# Patient Record
Sex: Female | Born: 1955 | Race: White | Hispanic: No | Marital: Married
Health system: Southern US, Community
[De-identification: ages and names within clinical notes are randomized; demographics above are authoritative.]

---

## 2003-02-19 ENCOUNTER — Emergency Department (HOSPITAL_COMMUNITY): Admission: EM | Admit: 2003-02-19 | Discharge: 2003-02-20 | Payer: Self-pay | Admitting: Podiatry

## 2003-12-16 ENCOUNTER — Other Ambulatory Visit: Payer: Self-pay

## 2003-12-16 ENCOUNTER — Emergency Department: Payer: Self-pay | Admitting: Emergency Medicine

## 2004-01-18 ENCOUNTER — Emergency Department: Payer: Self-pay | Admitting: Internal Medicine

## 2004-02-23 ENCOUNTER — Emergency Department: Payer: Self-pay | Admitting: Emergency Medicine

## 2004-06-30 ENCOUNTER — Inpatient Hospital Stay: Payer: Self-pay | Admitting: Internal Medicine

## 2005-01-07 ENCOUNTER — Emergency Department (HOSPITAL_COMMUNITY): Admission: EM | Admit: 2005-01-07 | Discharge: 2005-01-07 | Payer: Self-pay | Admitting: Emergency Medicine

## 2005-01-09 ENCOUNTER — Emergency Department (HOSPITAL_COMMUNITY): Admission: EM | Admit: 2005-01-09 | Discharge: 2005-01-09 | Payer: Self-pay | Admitting: Emergency Medicine

## 2005-01-10 ENCOUNTER — Emergency Department (HOSPITAL_COMMUNITY): Admission: EM | Admit: 2005-01-10 | Discharge: 2005-01-10 | Payer: Self-pay | Admitting: Emergency Medicine

## 2005-01-12 ENCOUNTER — Ambulatory Visit (HOSPITAL_COMMUNITY): Admission: RE | Admit: 2005-01-12 | Discharge: 2005-01-12 | Payer: Self-pay | Admitting: Emergency Medicine

## 2005-01-20 ENCOUNTER — Emergency Department (HOSPITAL_COMMUNITY): Admission: EM | Admit: 2005-01-20 | Discharge: 2005-01-21 | Payer: Self-pay | Admitting: Emergency Medicine

## 2005-04-05 ENCOUNTER — Emergency Department (HOSPITAL_COMMUNITY): Admission: EM | Admit: 2005-04-05 | Discharge: 2005-04-06 | Payer: Self-pay | Admitting: Emergency Medicine

## 2005-04-08 ENCOUNTER — Emergency Department (HOSPITAL_COMMUNITY): Admission: EM | Admit: 2005-04-08 | Discharge: 2005-04-08 | Payer: Self-pay | Admitting: Emergency Medicine

## 2005-04-11 ENCOUNTER — Ambulatory Visit: Payer: Self-pay | Admitting: Gastroenterology

## 2005-06-26 ENCOUNTER — Emergency Department (HOSPITAL_COMMUNITY): Admission: EM | Admit: 2005-06-26 | Discharge: 2005-06-26 | Payer: Self-pay | Admitting: Emergency Medicine

## 2005-09-12 ENCOUNTER — Inpatient Hospital Stay: Payer: Self-pay | Admitting: Internal Medicine

## 2005-09-23 ENCOUNTER — Emergency Department: Payer: Self-pay | Admitting: Emergency Medicine

## 2005-12-06 ENCOUNTER — Other Ambulatory Visit: Payer: Self-pay

## 2005-12-06 ENCOUNTER — Inpatient Hospital Stay: Payer: Self-pay | Admitting: Internal Medicine

## 2005-12-21 ENCOUNTER — Inpatient Hospital Stay: Payer: Self-pay | Admitting: Internal Medicine

## 2006-01-05 ENCOUNTER — Emergency Department (HOSPITAL_COMMUNITY): Admission: EM | Admit: 2006-01-05 | Discharge: 2006-01-05 | Payer: Self-pay | Admitting: Emergency Medicine

## 2006-01-24 ENCOUNTER — Emergency Department: Payer: Self-pay

## 2006-03-28 ENCOUNTER — Inpatient Hospital Stay: Payer: Self-pay | Admitting: Internal Medicine

## 2006-07-04 ENCOUNTER — Inpatient Hospital Stay: Payer: Self-pay | Admitting: Internal Medicine

## 2006-09-08 ENCOUNTER — Emergency Department: Payer: Self-pay | Admitting: Emergency Medicine

## 2006-09-10 ENCOUNTER — Inpatient Hospital Stay: Payer: Self-pay | Admitting: Internal Medicine

## 2006-11-30 ENCOUNTER — Inpatient Hospital Stay: Payer: Self-pay | Admitting: Internal Medicine

## 2007-02-25 ENCOUNTER — Emergency Department: Payer: Self-pay | Admitting: Emergency Medicine

## 2007-04-27 IMAGING — CR DG CHEST 2V
1 series · 2 of 2 positions shown · non-contrast
Comparison: none

REASON FOR EXAM: Cough
COMMENTS:

[Series 1: view not recorded · 0.17mm/px · 2 of 2 slices shown]
[im 1/2]
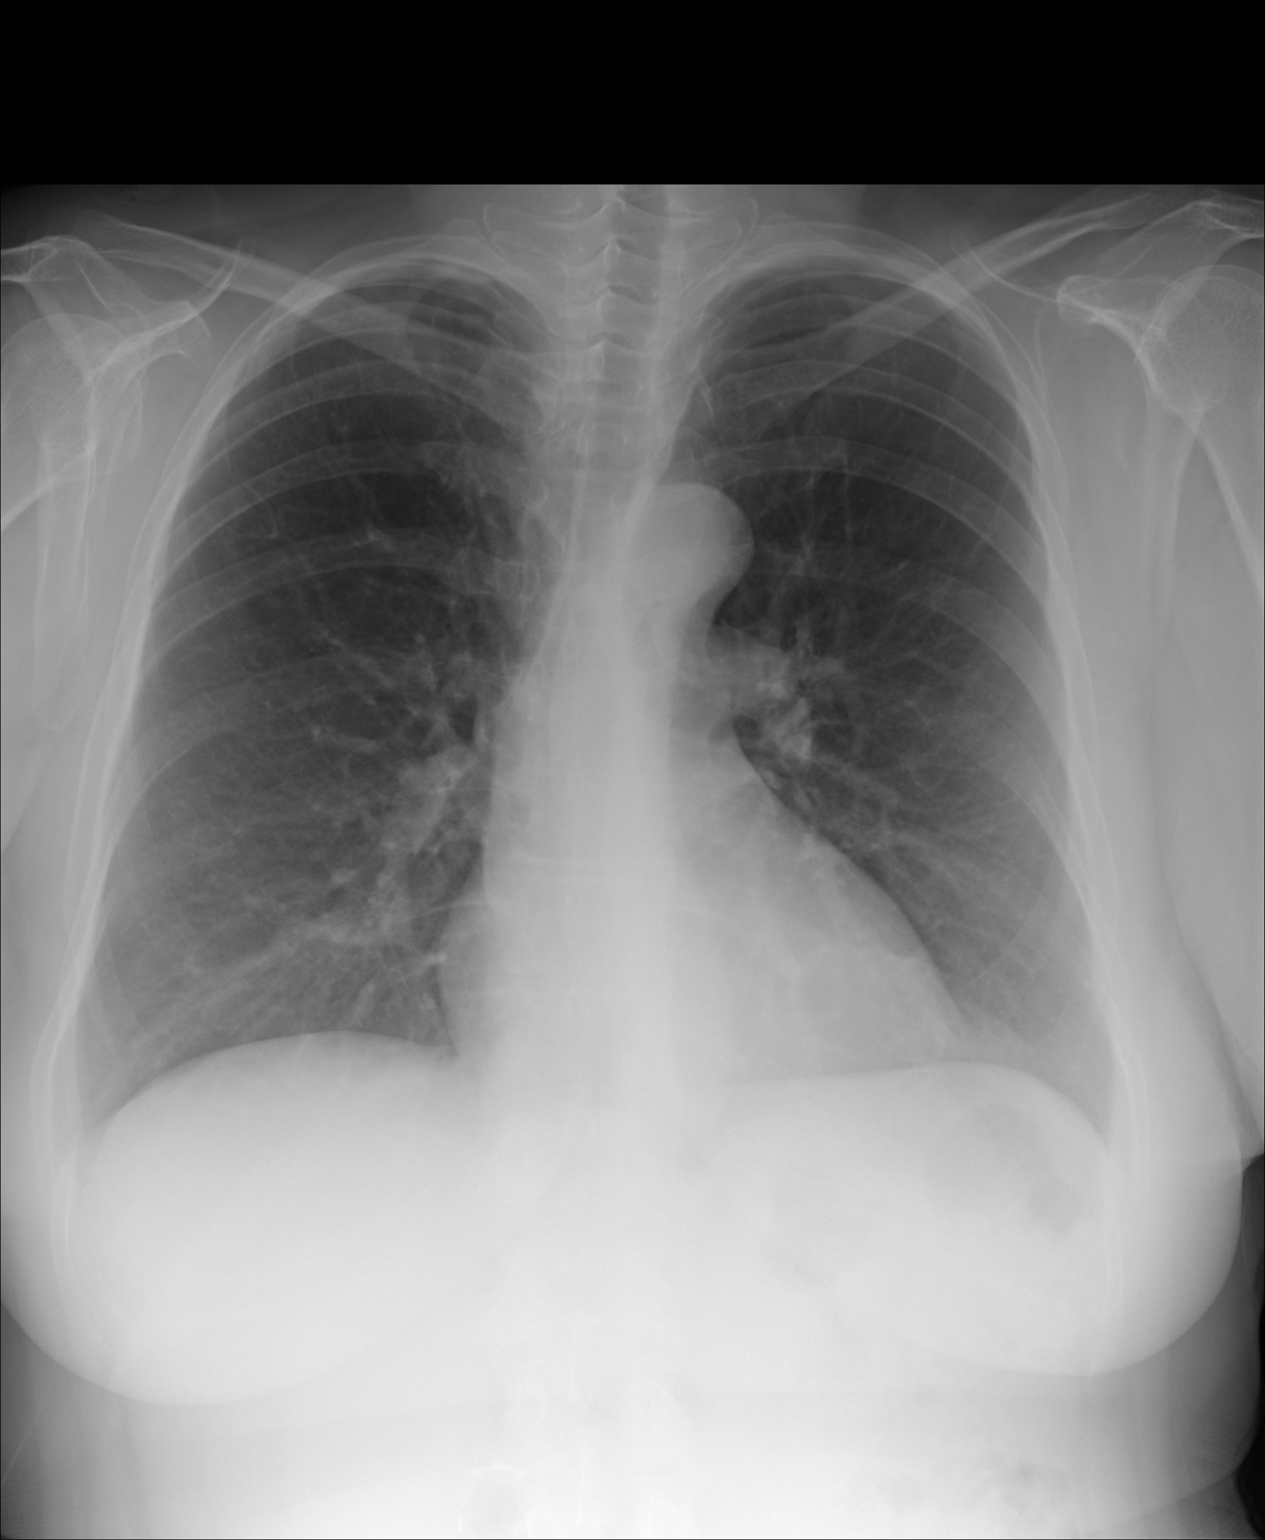
[im 2/2]
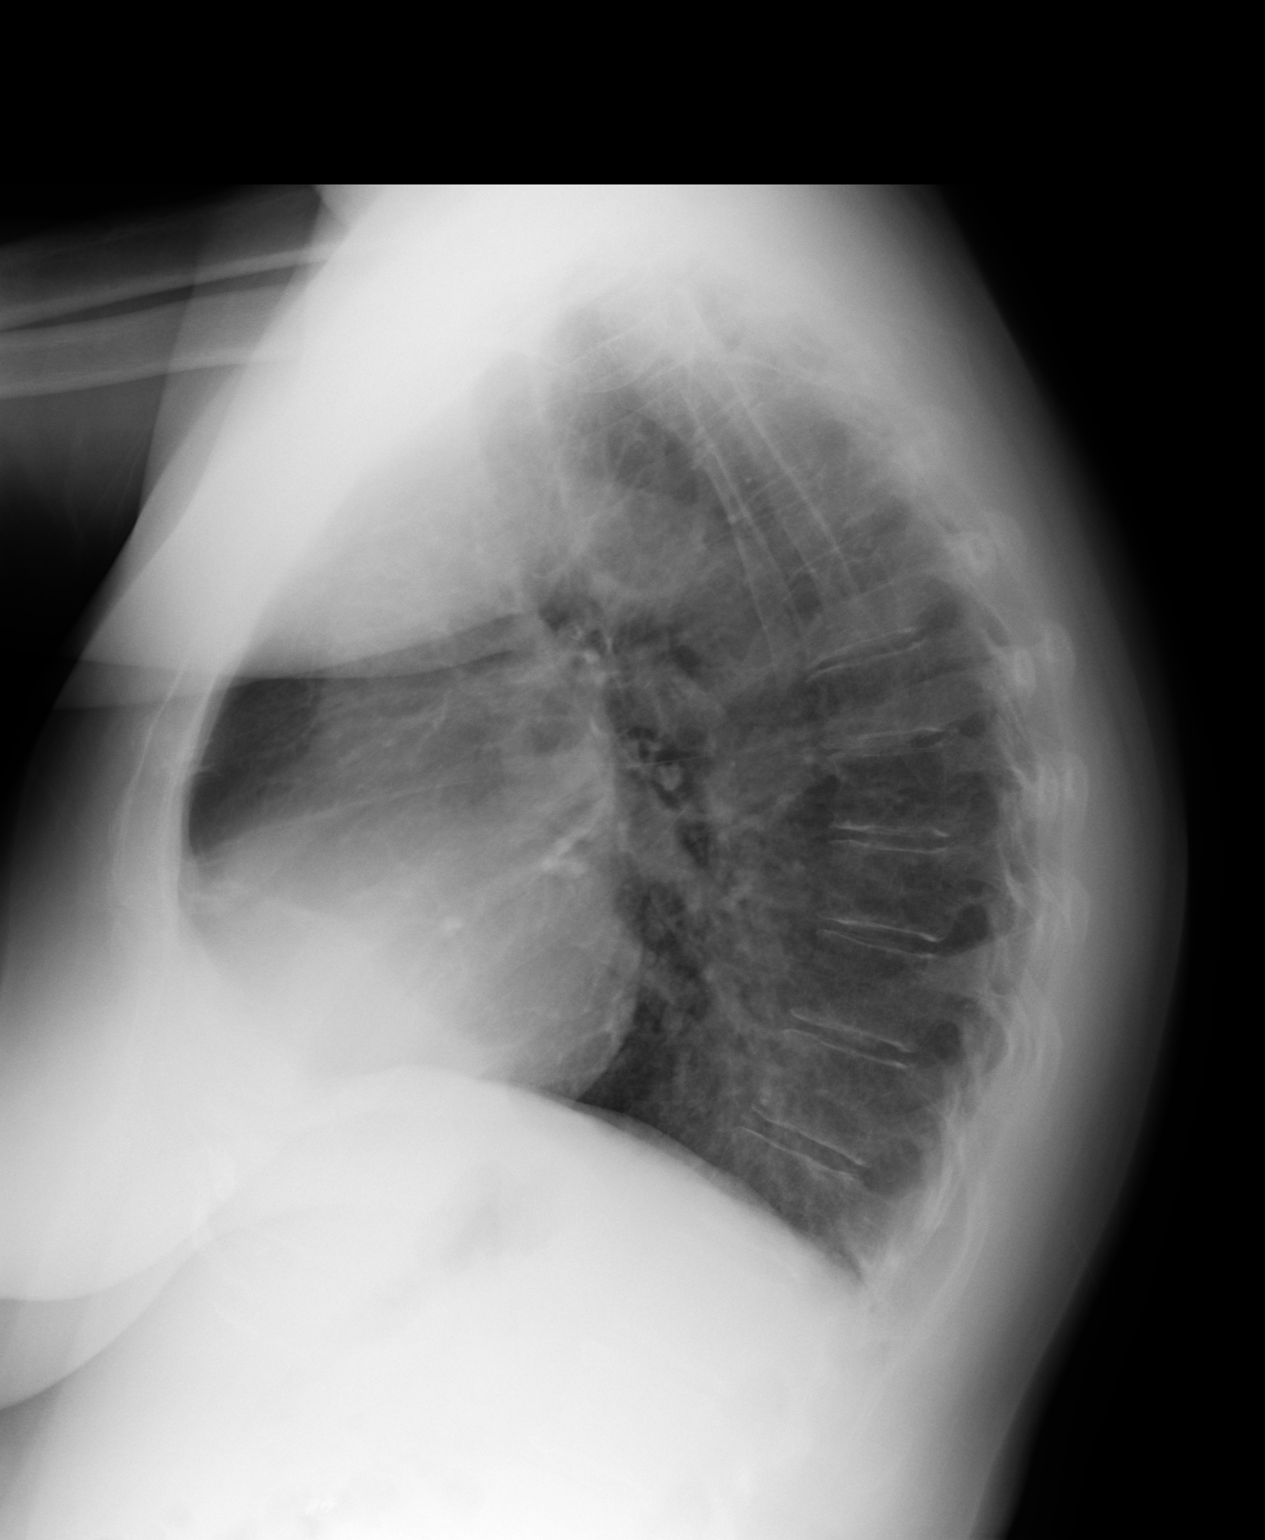

[2 of 2 positions shown; findings below may reference images not displayed]

PROCEDURE:     DXR - DXR CHEST PA (OR AP) AND LATERAL  - January 24, 2006  [DATE]

RESULT:       Comparison is made to the study of 12/06/05.  The lungs are
clear.  The cardiac silhouette and pulmonary vasculature are normal.  The
bony and mediastinal structures are within normal limits.  There is no
pneumothorax.  There is no focal mass.
IMPRESSION: No acute cardiopulmonary disease demonstrated.  There is mild hyperinflation
which can suggest COPD.

## 2007-04-30 ENCOUNTER — Emergency Department: Payer: Self-pay | Admitting: Emergency Medicine

## 2007-05-08 ENCOUNTER — Emergency Department: Payer: Self-pay | Admitting: Emergency Medicine

## 2007-07-03 ENCOUNTER — Emergency Department: Payer: Self-pay | Admitting: Internal Medicine

## 2007-08-23 ENCOUNTER — Emergency Department: Payer: Self-pay | Admitting: Emergency Medicine

## 2007-10-17 ENCOUNTER — Emergency Department: Payer: Self-pay | Admitting: Emergency Medicine

## 2008-03-25 ENCOUNTER — Emergency Department: Payer: Self-pay | Admitting: Emergency Medicine

## 2008-07-12 ENCOUNTER — Emergency Department: Payer: Self-pay | Admitting: Emergency Medicine

## 2008-07-15 ENCOUNTER — Emergency Department: Payer: Self-pay | Admitting: Emergency Medicine

## 2008-09-24 ENCOUNTER — Emergency Department: Payer: Self-pay | Admitting: Emergency Medicine

## 2008-11-23 IMAGING — CR DG ABDOMEN 3V
1 series · 4 of 4 positions shown · non-contrast
Comparison: none

REASON FOR EXAM: Abdominal pain, history of Crohn's disease
COMMENTS:

[Series 1: view not recorded · 0.17mm/px · 4 of 4 slices shown]
[im 1/4]
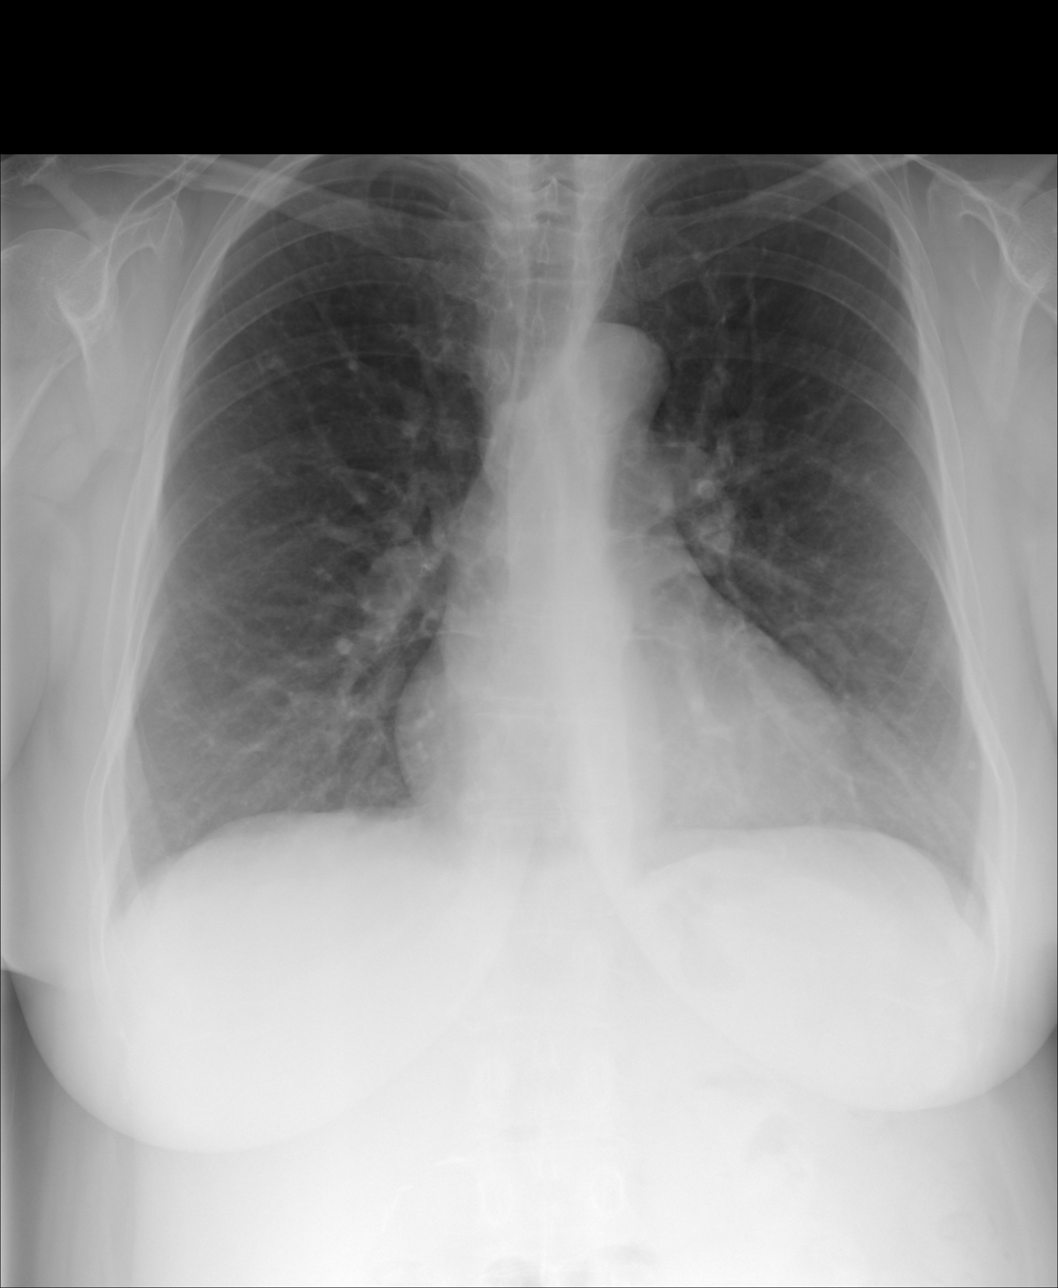
[im 2/4]
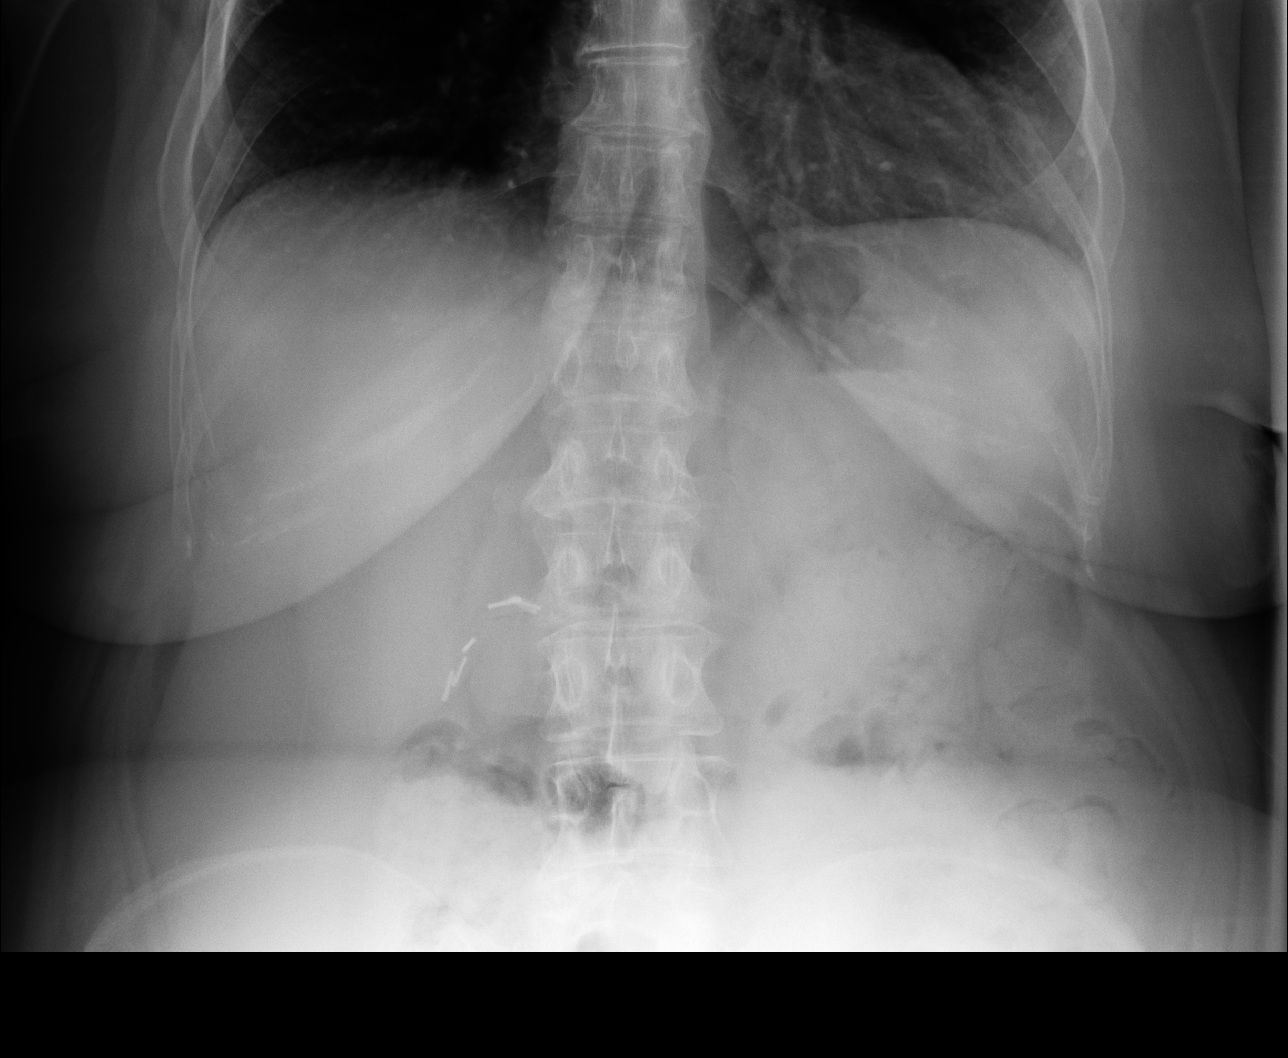
[im 3/4]
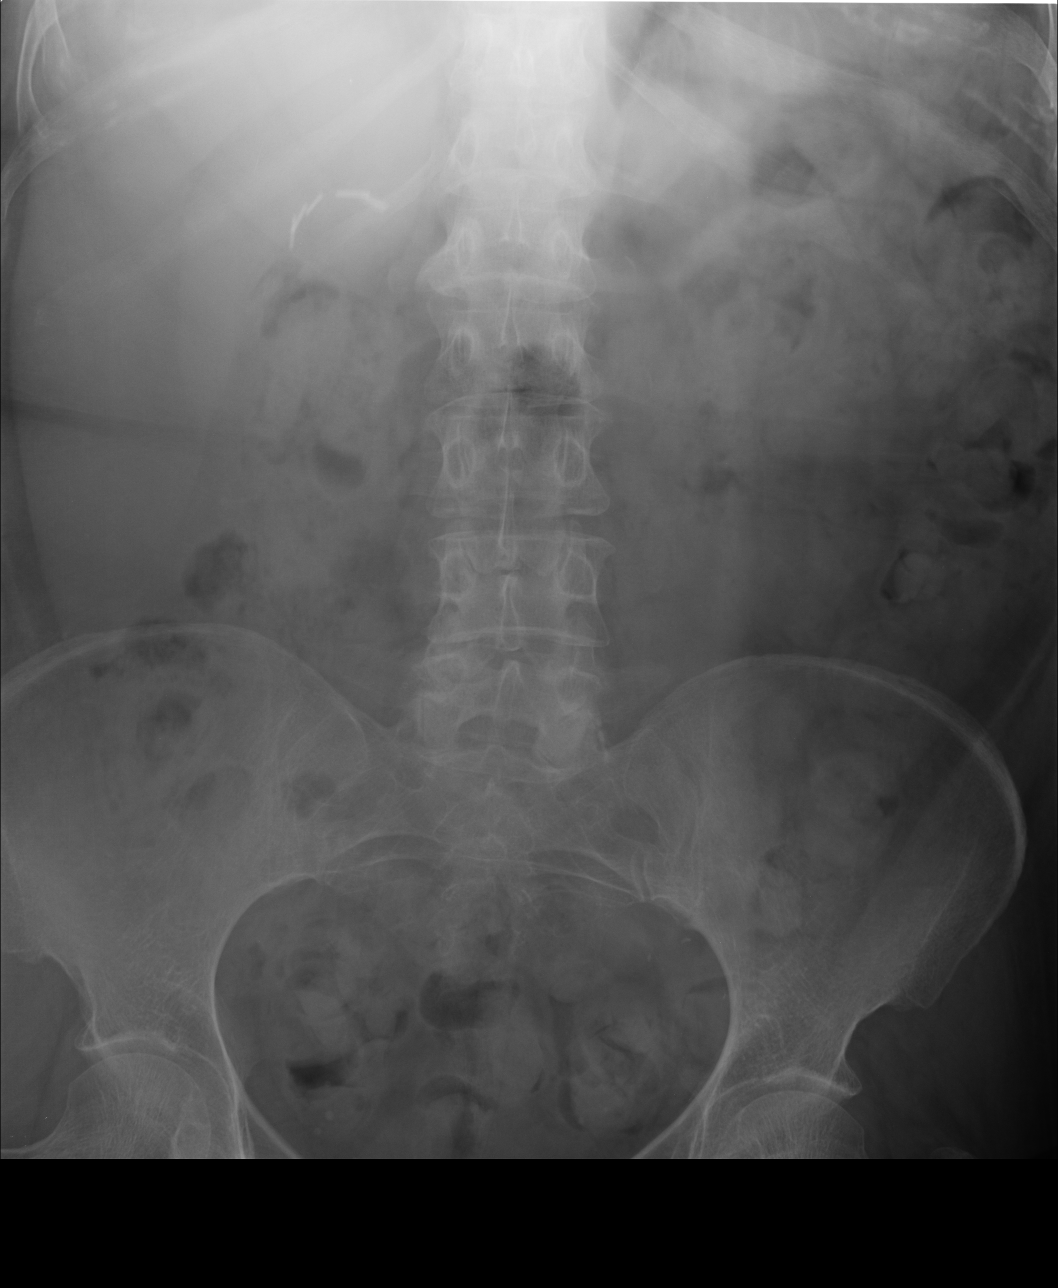
[im 4/4]
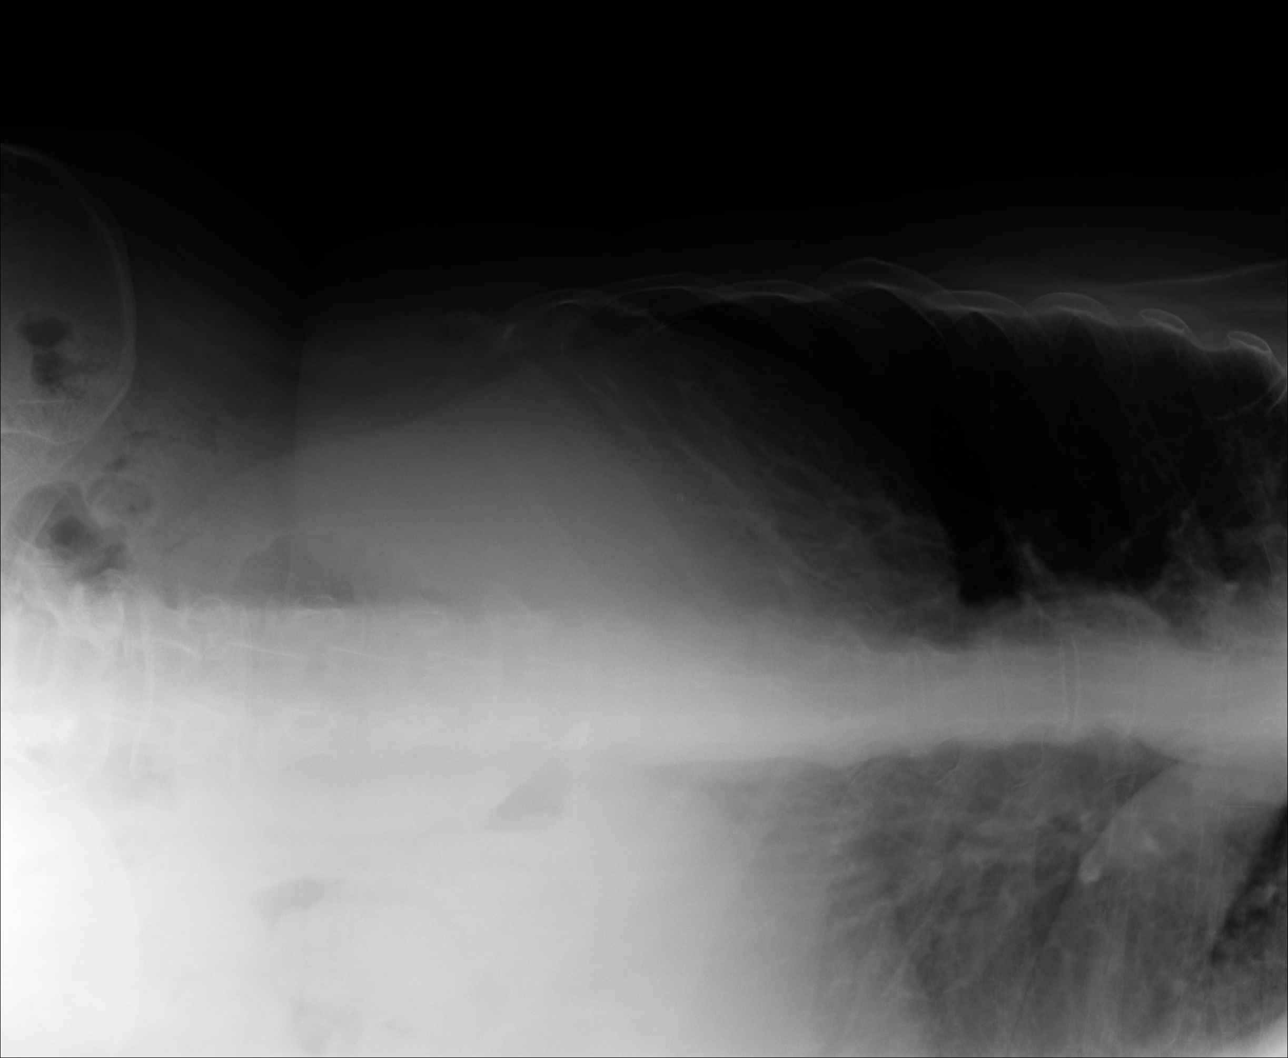

[4 of 4 positions shown; findings below may reference images not displayed]

PROCEDURE:     DXR - DXR ABDOMEN 3-WAY (INCL PA CXR)  - August 23, 2007  [DATE]

RESULT:     Comparison is made to the chest series dated 09/08/2006 as a
portion of a three way abdominal series.

The lungs remain clear. The heart is normal in size.

Cholecystectomy clips are present. The bowel gas pattern is unremarkable.
The bony structures are within normal limits. There is no free air.
IMPRESSION: 1.  No acute cardiopulmonary disease.
2.  No evidence of bowel obstruction or perforation.
3.  Cholecystectomy changes are present.

## 2009-04-11 ENCOUNTER — Emergency Department: Payer: Self-pay | Admitting: Internal Medicine

## 2009-08-01 ENCOUNTER — Emergency Department: Payer: Self-pay | Admitting: Emergency Medicine

## 2009-08-05 ENCOUNTER — Inpatient Hospital Stay: Payer: Self-pay | Admitting: Internal Medicine

## 2009-10-13 IMAGING — CR DG CHEST 2V
1 series · 2 of 2 positions shown · non-contrast
Comparison: none

REASON FOR EXAM: rib pain
COMMENTS:

PROCEDURE:     DXR - DXR CHEST PA (OR AP) AND LATERAL  - July 12, 2008  [DATE]
RESULT:     Comparison: [id number], 07/03/2007

[Series 1: view not recorded · 0.17mm/px · 2 of 2 slices shown]
[im 1/2]
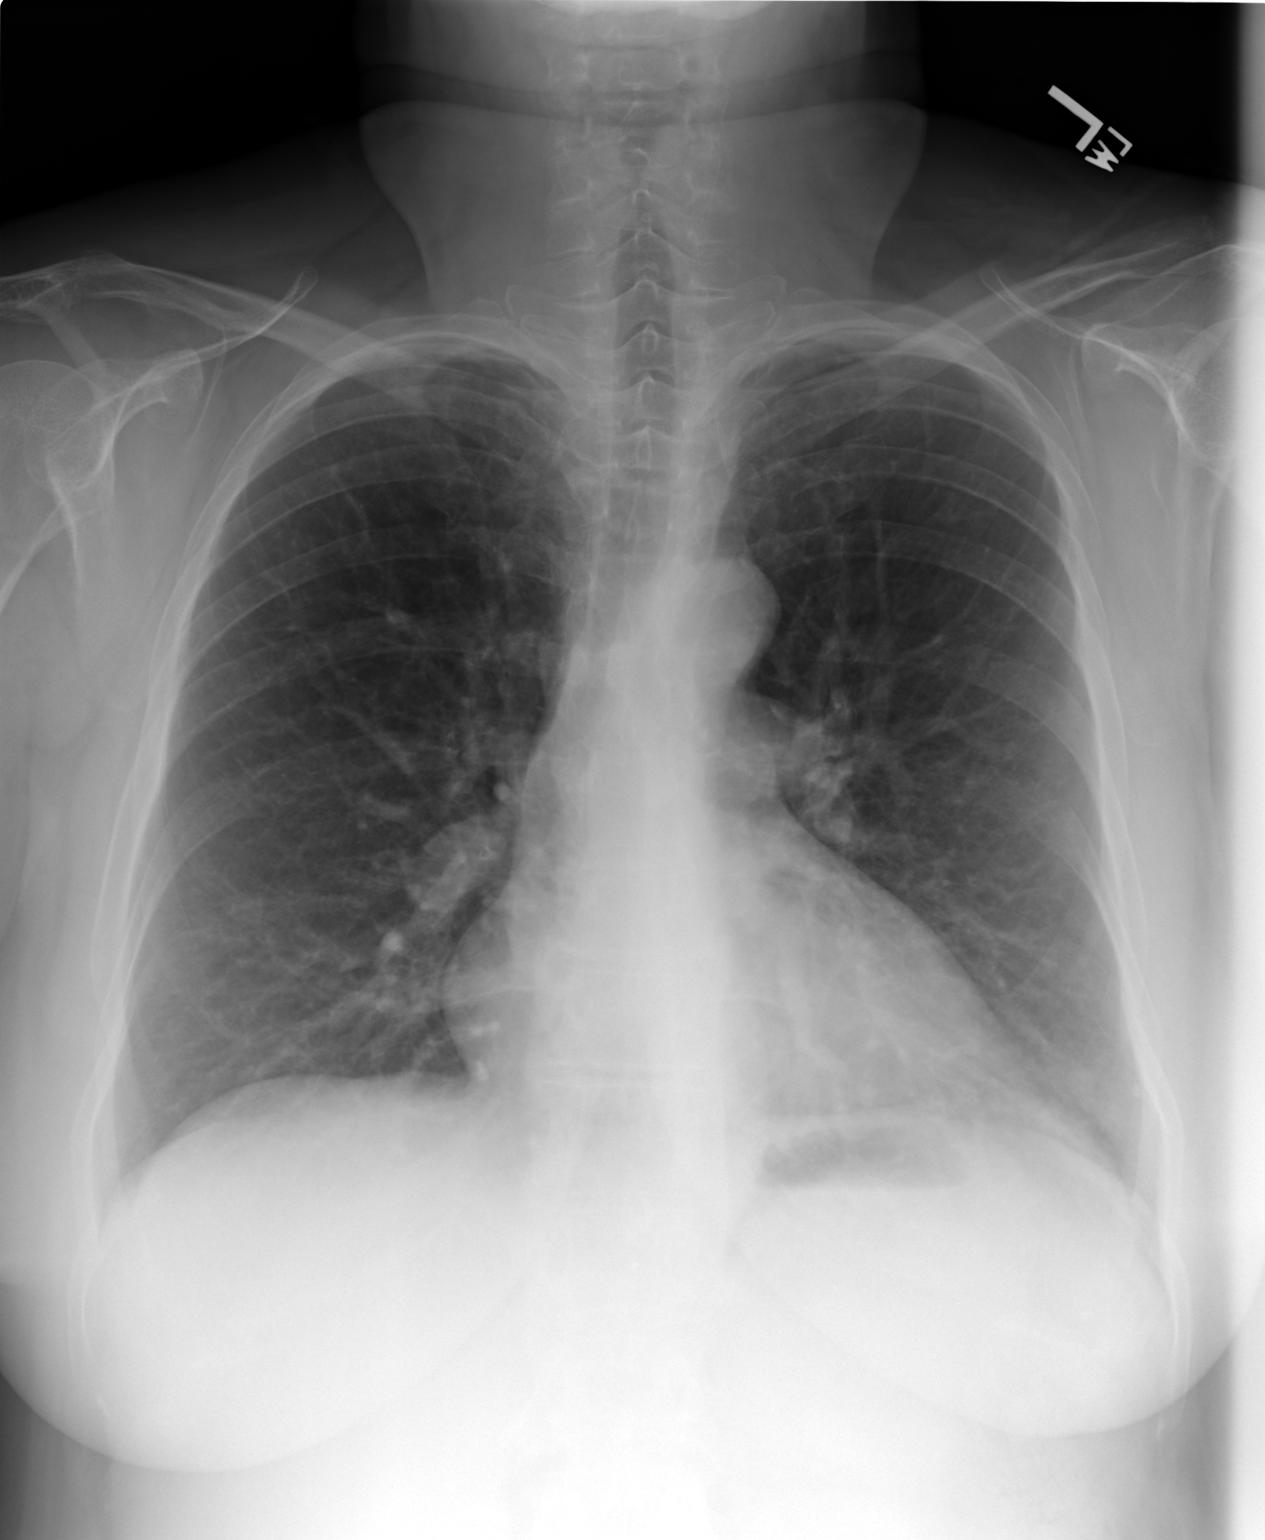
[im 2/2]
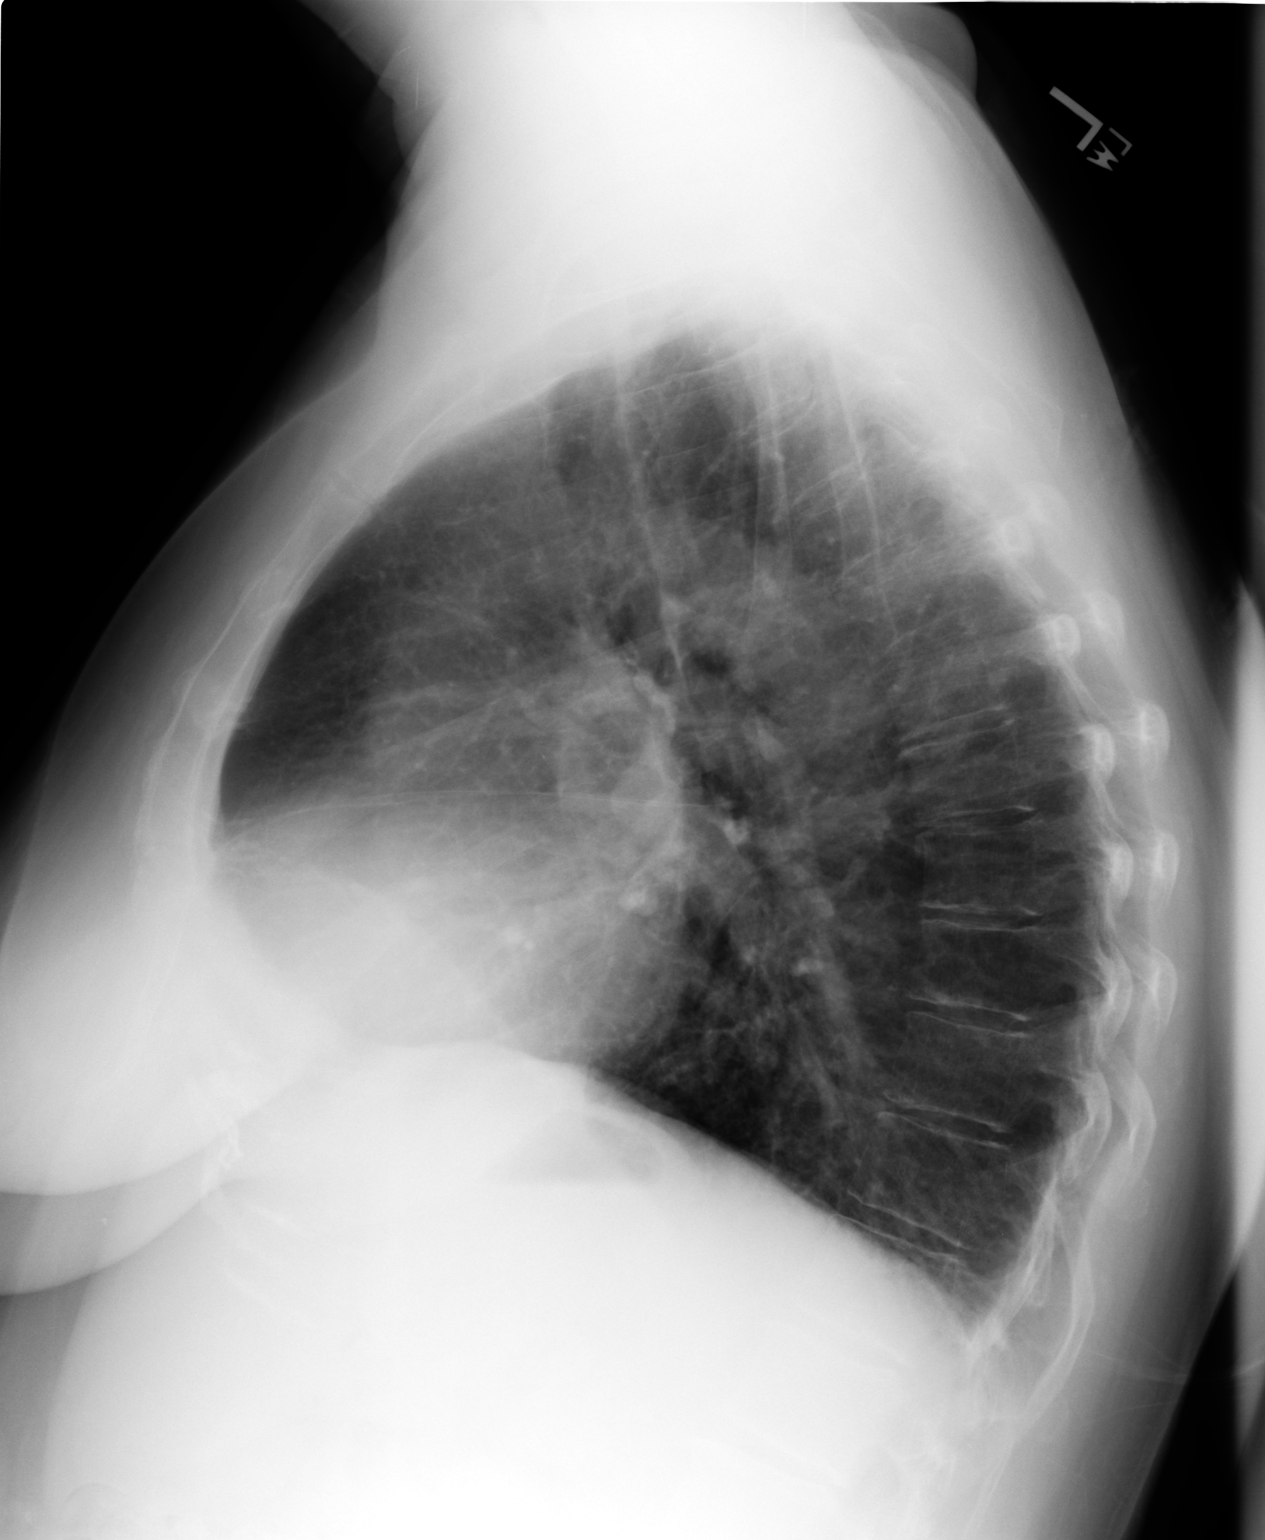

[2 of 2 positions shown; findings below may reference images not displayed]

FINDINGS: PA and lateral chest radiographs are provided. There is no focal parenchymal
opacity, pleural effusion, or pneumothorax. The heart and mediastinum are
unremarkable. The osseous structures are unremarkable.
IMPRESSION: No acute disease of the chest.

## 2009-10-13 IMAGING — CT CT STONE STUDY
1 of 2 series · 15 of 32 positions shown, 19 images · non-contrast
Comparison: 10/17/2007, 09/10/2006, 03/23/2006

REASON FOR EXAM: back achy (R side)_ pt thinks feels like prior renal
colic
COMMENTS:

PROCEDURE:     CT  - CT ABDOMEN /PELVIS WO (STONE)  - July 12, 2008  [DATE]
RESULT:     Indication: Right-sided pain
TECHNIQUE: Multiple axial images from the lung bases to the symphysis pubis
were obtained without oral or intravenous contrast.

[Series 2: stone · axial · 0.77mm/px · z∈[-450,-66]mm · 15 of 145 slices shown, 19 images]
[im 11/145  soft-tissue]
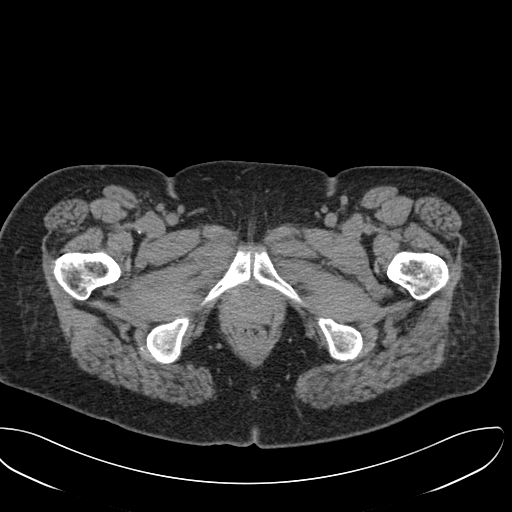
[im 11/145  bone]
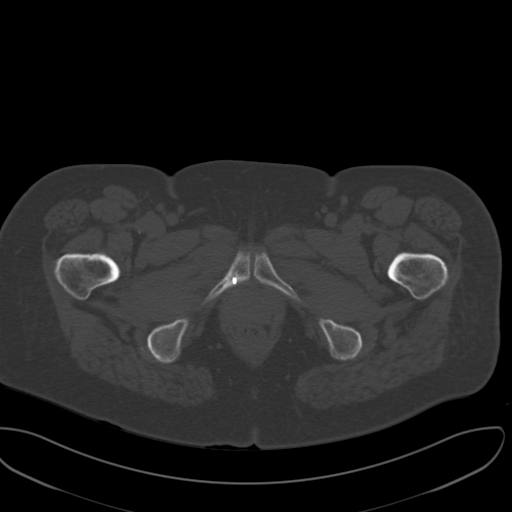
[im 22/145  soft-tissue]
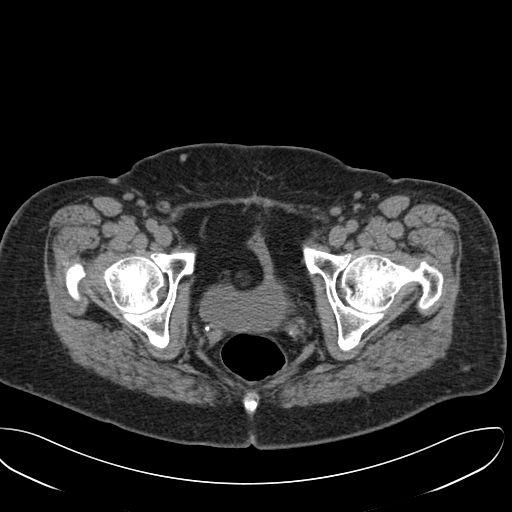
[im 33/145  soft-tissue]
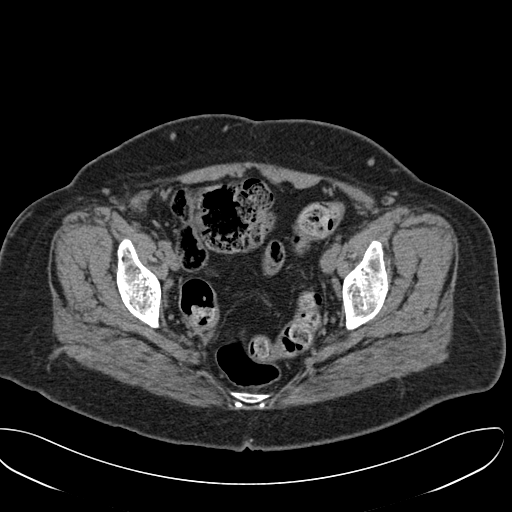
[im 43/145  soft-tissue]
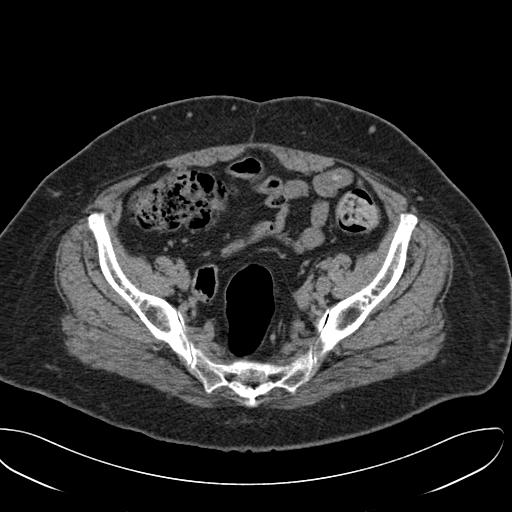
[im 54/145  soft-tissue]
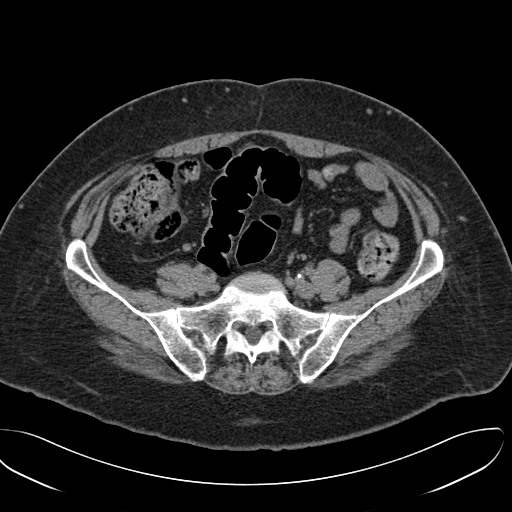
[im 65/145  soft-tissue]
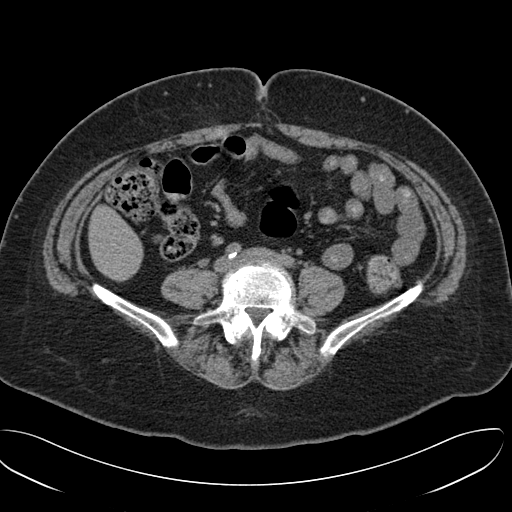
[im 75/145  soft-tissue]
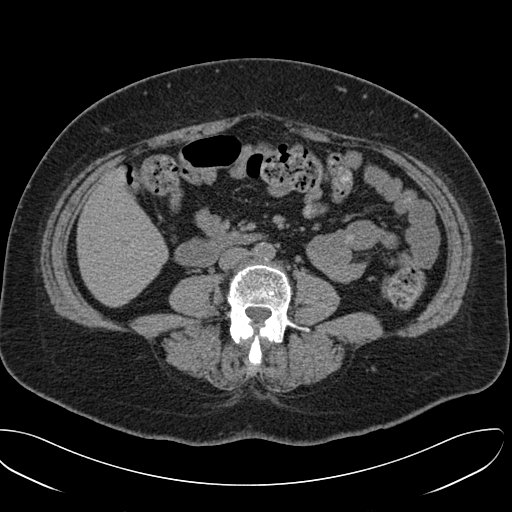
[im 86/145  soft-tissue]
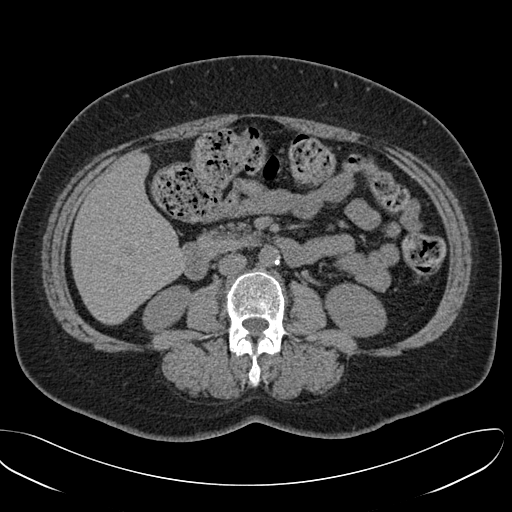
[im 97/145  soft-tissue]
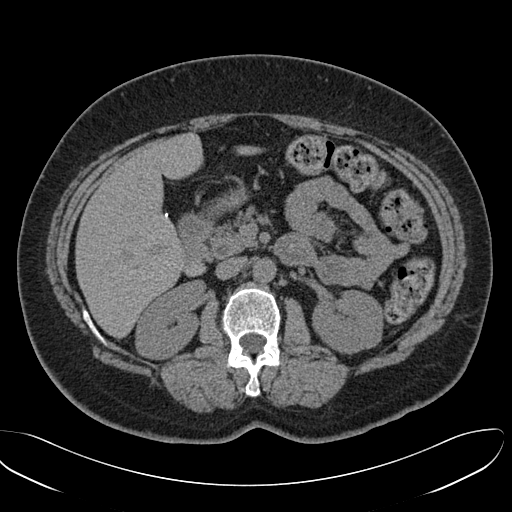
[im 97/145  bone]
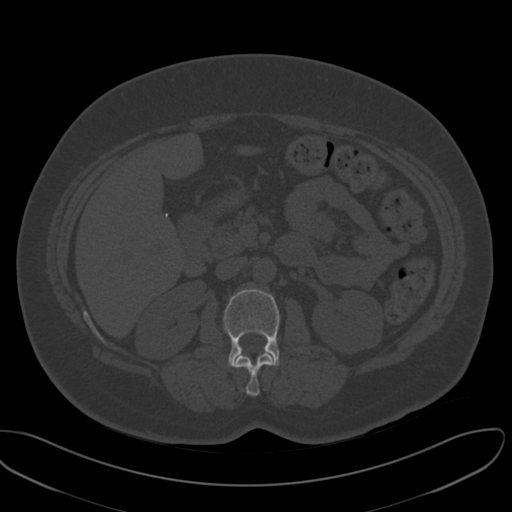
[im 107/145  soft-tissue]
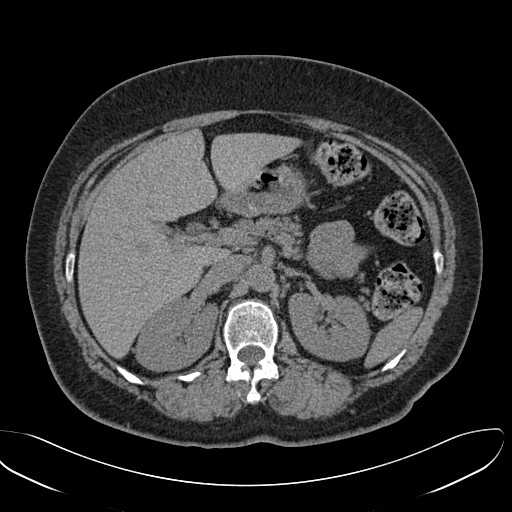
[im 118/145  soft-tissue]
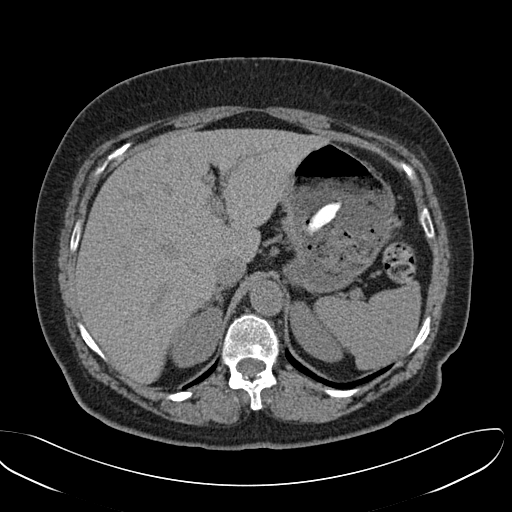
[im 123/145  lung]
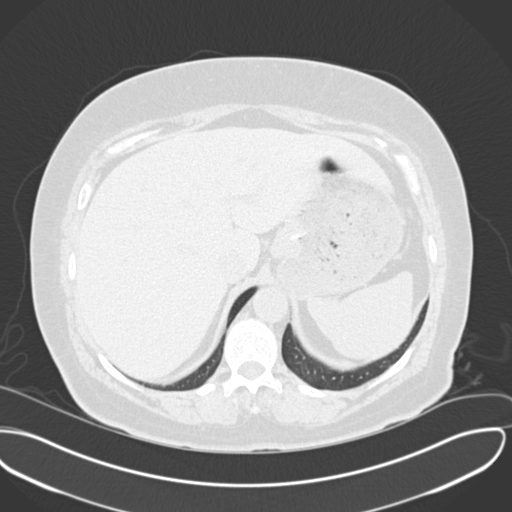
[im 129/145  soft-tissue]
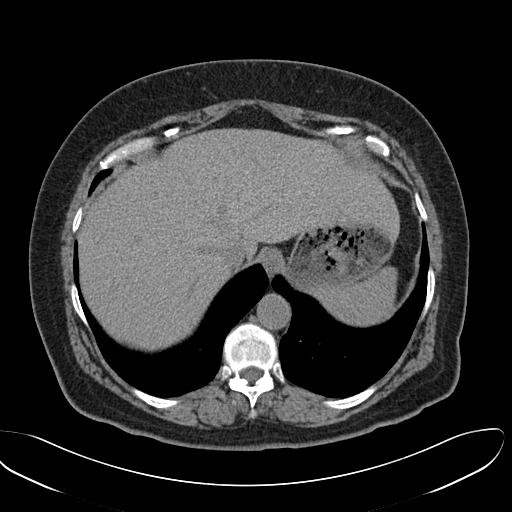
[im 129/145  lung]
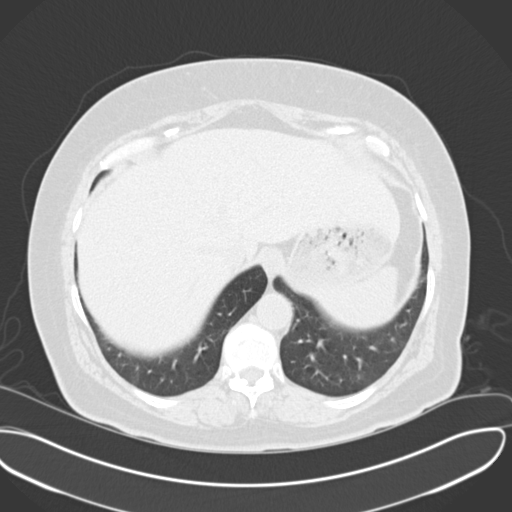
[im 134/145  lung]
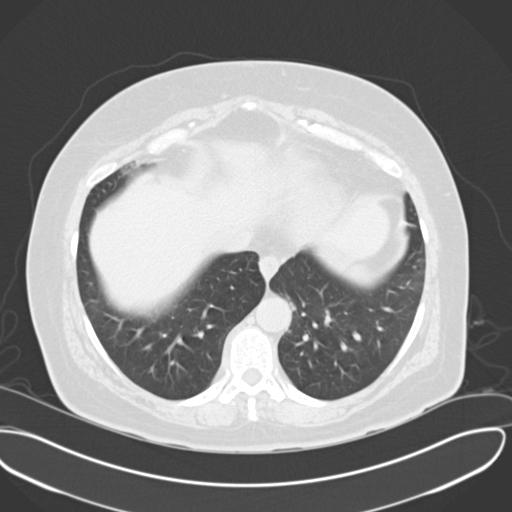
[im 139/145  soft-tissue]
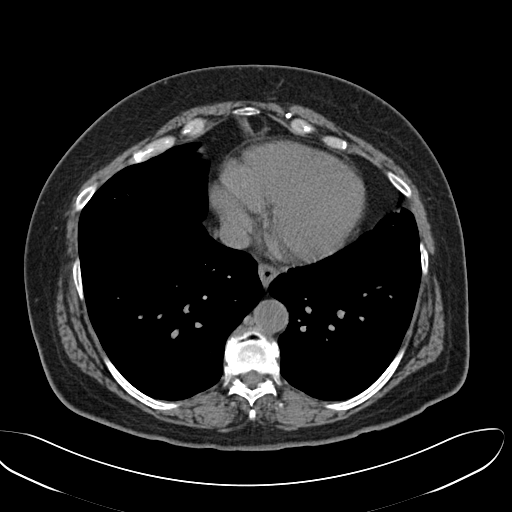
[im 139/145  lung]
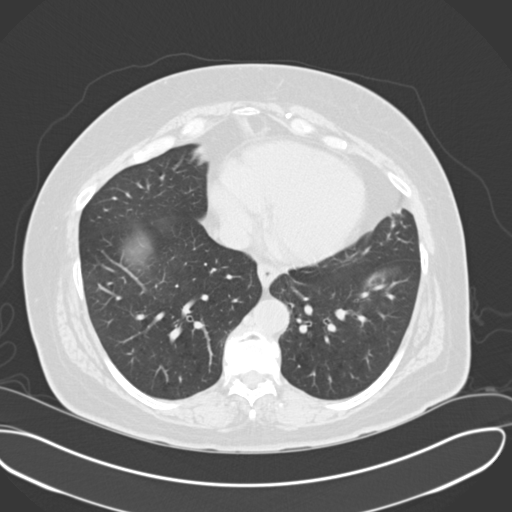

[15 of 32 positions shown; findings below may reference images not displayed]

FINDINGS: The lung bases are clear. There is no pleural or pericardial effusions.

No renal, ureteral, or bladder calculi. No obstructive uropathy. No
perinephric stranding is seen. The kidneys are symmetric in size without
evidence for exophytic mass. The bladder is unremarkable.

The liver demonstrates no focal abnormality. The gallbladder is surgically
absent. The spleen demonstrates no focal abnormality. The adrenal glands and
pancreas are normal.

The unopacified stomach, duodenum, small intestine, and large intestine are
unremarkable, but evaluation is limited by lack of oral contrast. There is a
moderate amount of stool throughout the colon. There is no pneumoperitoneum,
pneumatosis, or portal venous gas. There is no abdominal or pelvic free
fluid. There is no lymphadenopathy.

The abdominal aorta is normal in caliber with scattered atherosclerosis.

There is lumbar spine spondylosis.
IMPRESSION: 1. No urolithiasis or obstructive uropathy.

2. Moderate amount of stool throughout the colon.

## 2011-01-19 LAB — URINALYSIS, COMPLETE
Bilirubin,UR: NEGATIVE
Glucose,UR: NEGATIVE mg/dL (ref 0–75)
Nitrite: NEGATIVE
Ph: 6 (ref 4.5–8.0)
RBC,UR: 2 /HPF (ref 0–5)
Squamous Epithelial: 1

## 2011-01-19 LAB — BASIC METABOLIC PANEL
BUN: 8 mg/dL (ref 7–18)
Creatinine: 0.5 mg/dL — ABNORMAL LOW (ref 0.60–1.30)
EGFR (Non-African Amer.): >60
Glucose: 112 mg/dL — ABNORMAL HIGH (ref 65–99)
Potassium: 4.4 mmol/L (ref 3.5–5.1)
Sodium: 137 mmol/L (ref 136–145)

## 2011-01-19 LAB — CBC
HGB: 14.4 g/dL (ref 12.0–16.0)
MCHC: 32.3 g/dL (ref 32.0–36.0)
WBC: 17.3 10*3/uL — ABNORMAL HIGH (ref 3.6–11.0)

## 2011-01-20 ENCOUNTER — Inpatient Hospital Stay: Payer: Self-pay | Admitting: Internal Medicine

## 2011-01-20 LAB — CK TOTAL AND CKMB (NOT AT ARMC)
CK, Total: 195 U/L (ref 21–215)
CK-MB: 1.4 ng/mL (ref 0.5–3.6)
CK-MB: 3.6 ng/mL (ref 0.5–3.6)

## 2011-01-20 LAB — TROPONIN I: Troponin-I: <0.02 ng/mL

## 2011-01-21 LAB — CBC WITH DIFFERENTIAL/PLATELET
Basophil %: 0.2 %
Eosinophil #: 0 10*3/uL (ref 0.0–0.7)
Eosinophil %: 0 %
HCT: 41.7 % (ref 35.0–47.0)
Lymphocyte %: 8.2 %
MCHC: 32.6 g/dL (ref 32.0–36.0)
MCV: 91 fL (ref 80–100)
Monocyte #: 0.8 10*3/uL — ABNORMAL HIGH (ref 0.0–0.7)
Monocyte %: 4.3 %
RDW: 17.1 % — ABNORMAL HIGH (ref 11.5–14.5)
WBC: 19.8 10*3/uL — ABNORMAL HIGH (ref 3.6–11.0)

## 2011-01-21 LAB — BASIC METABOLIC PANEL
Anion Gap: 14 (ref 7–16)
BUN: 13 mg/dL (ref 7–18)
Chloride: 99 mmol/L (ref 98–107)
Creatinine: 0.58 mg/dL — ABNORMAL LOW (ref 0.60–1.30)
EGFR (African American): >60

## 2011-01-21 LAB — URINE CULTURE

## 2011-01-21 LAB — LIPID PANEL: Ldl Cholesterol, Calc: 142 mg/dL — ABNORMAL HIGH (ref 0–100)

## 2011-01-21 LAB — HEMOGLOBIN A1C: Hemoglobin A1C: 5.8 % (ref 4.2–6.3)

## 2011-01-22 LAB — TROPONIN I: Troponin-I: <0.02 ng/mL

## 2011-01-22 LAB — CK TOTAL AND CKMB (NOT AT ARMC)
CK, Total: 138 U/L (ref 21–215)
CK-MB: 4.3 ng/mL — ABNORMAL HIGH (ref 0.5–3.6)
CK-MB: 2.2 ng/mL (ref 0.5–3.6)

## 2011-01-22 LAB — BASIC METABOLIC PANEL
Chloride: 101 mmol/L (ref 98–107)
Co2: 30 mmol/L (ref 21–32)
Creatinine: 0.75 mg/dL (ref 0.60–1.30)
EGFR (African American): >60
Potassium: 3.6 mmol/L (ref 3.5–5.1)
Sodium: 141 mmol/L (ref 136–145)

## 2011-01-23 LAB — CK TOTAL AND CKMB (NOT AT ARMC)
CK, Total: 52 U/L (ref 21–215)
CK-MB: 1.9 ng/mL (ref 0.5–3.6)

## 2011-01-24 LAB — WBC: WBC: 19.1 10*3/uL — ABNORMAL HIGH (ref 3.6–11.0)

## 2011-04-12 ENCOUNTER — Emergency Department: Payer: Self-pay | Admitting: Emergency Medicine

## 2011-04-12 LAB — URINALYSIS, COMPLETE
Bilirubin,UR: NEGATIVE
Glucose,UR: NEGATIVE mg/dL (ref 0–75)
Ketone: NEGATIVE
Nitrite: NEGATIVE
Ph: 7 (ref 4.5–8.0)
Protein: NEGATIVE
WBC UR: <1 /HPF (ref 0–5)

## 2011-04-12 LAB — COMPREHENSIVE METABOLIC PANEL
Albumin: 3.1 g/dL — ABNORMAL LOW (ref 3.4–5.0)
Alkaline Phosphatase: 109 U/L (ref 50–136)
Anion Gap: 11 (ref 7–16)
BUN: 5 mg/dL — ABNORMAL LOW (ref 7–18)
Bilirubin,Total: 0.2 mg/dL (ref 0.2–1.0)
EGFR (African American): >60
Glucose: 106 mg/dL — ABNORMAL HIGH (ref 65–99)
Sodium: 140 mmol/L (ref 136–145)
Total Protein: 8.6 g/dL — ABNORMAL HIGH (ref 6.4–8.2)

## 2011-04-12 LAB — CBC
HCT: 40.7 % (ref 35.0–47.0)
MCHC: 32.3 g/dL (ref 32.0–36.0)
Platelet: 496 10*3/uL — ABNORMAL HIGH (ref 150–440)
RDW: 17.7 % — ABNORMAL HIGH (ref 11.5–14.5)
WBC: 16.6 10*3/uL — ABNORMAL HIGH (ref 3.6–11.0)

## 2011-04-12 LAB — LIPASE, BLOOD: Lipase: 56 U/L — ABNORMAL LOW (ref 73–393)

## 2011-05-04 DEATH — deceased

## 2014-04-27 NOTE — H&P (Signed)
PATIENT NAME:  Rebecca Larson, Rebecca Larson MR#:  [id number] DATE OF BIRTH:  1955/12/30  DATE OF ADMISSION:  01/20/2011  ADDENDUM: Continuation of dictation.  REVIEW OF SYSTEMS: CONSTITUTIONAL: The patient denies any fevers, chills, or night sweats. HEENT: The patient denies any hearing loss, dysphagia, visual problems, or sore throat. CARDIOVASCULAR: The patient denies any orthopnea, PND, or syncope. RESPIRATORY: The patient denies any cough, wheezing, or hemoptysis. GASTROINTESTINAL: The patient denies any abdominal pain, hematemesis, hematochezia, or melena. GU: The patient denies any hematuria, dysuria, or frequency. NEUROLOGIC: The patient denies any headache, focal weakness, or seizures. SKIN: The patient denies any lesions or rash. ENDOCRINE: The patient denies any polyuria, polyphagia, or polydipsia. MUSCULOSKELETAL: The patient denies any arthritis, joint effusion, or swelling. HEMATOLOGICAL: The patient denies any easy bleeding or bruises.   PHYSICAL EXAMINATION:   VITAL SIGNS: Temperature 98.5, heart rate 120, respiratory rate 26, blood pressure 166/94, and oxygen saturation 97%.   HEENT: Atraumatic, normocephalic. Pupils are equal, round, and reactive to light. Extraocular movements are intact. Sclerae anicteric. Mucous membranes are dry.   NECK: Supple. No organomegaly.   CARDIOVASCULAR: S1 and S2, regular rate and rhythm. No gallops. No thrills. No murmurs.   RESPIRATORY: The patient has end-expiratory wheezes bilaterally.   GI: Abdomen is soft, nontender, and nondistended. Normal bowel sounds. No hepatosplenomegaly.   GU: There is no hematuria or masses noted.   SKIN: No lesions and no rash.   ENDOCRINE: No masses and no thyromegaly.   LYMPH: No lymphadenopathy or nodes palpable.   NEUROLOGIC: Cranial nerves II through XII grossly intact. Motor strength is 5/5 in bilateral upper and lower extremities. Sensation is within normal limits. No focal neurological deficits are noted on  examination.   MUSCULOSKELETAL: No arthritis, joint effusion, or swelling.   HEMATOLOGICAL: The patient does not have any ecchymosis, bleeding, or petechiae.   EXTREMITIES: No cyanosis, no clubbing, and no edema. 2+ pedal pulses are noted bilaterally.   LABS/STUDIES: EKG: Sinus tachycardia at 122 beats per minute, low voltage QRS.   Glucose 112, BUN 8, creatinine 0.50, sodium 137, potassium 4.4, chloride 95, CO2 32, calcium 10.3. Estimated GFR is greater than 60. WBC count 17,300, hemoglobin 14.4, hematocrit 44.5, platelet count 320, MCV 91.   Urinalysis: 3+ leukocyte esterase and 10 WBCs.   ASSESSMENT AND PLAN:  1. The patient is a 59 year old female who presents with the chief complaint of shortness of breath, wheezing, productive cough, and acute chronic obstructive pulmonary disease exacerbation: We will admit to the medical floor. Start the patient on chronic obstructive pulmonary disease clinical pathway orders, IV Solu-Medrol, Rocephin, DuoNebs, IV fluids, and oxygen. The patient will be monitored closely. The patient has a history of acute respiratory failure due to hypercapnia and has required intubation in the past.  2. Urinary tract infection and leukocytosis: Check urine cultures. The patient is receiving IV antibiotics. 3. Hypercalcemia: Check PTH. Give IV hydration. Monitor calcium levels closely. 4. Anxiety: Continue diazepam. 5. Depression: Continue Effexor. 6. Crohn's disease:  Continue Asacol. 7. Chronic obstructive pulmonary disease: Continue Advair. 8. Chronic back pain: Continue methadone.   PAST MEDICAL HISTORY:  1. Altered mental status. 2. Rhabdomyolysis. 3. Elevated troponin.  4. Ejection fraction 50%.  5. Acute renal failure. 6. Transaminitis. 7. Coagulopathy. 8. Psoriasis.  9. Left foot pain.  10. Nephrolithiasis. ____________________________ Rebecca AstJignesh S. Eugene Isadore, Rebecca Larson jsp:slb D: 01/20/2011 01:36:57 ET T: 01/20/2011 08:13:26 ET JOB#: [id number]  cc: Rebecca AstJignesh  S. Tiaira Arambula, Rebecca Larson, <Dictator> Rebecca AstJIGNESH S Rayvn Rickerson Rebecca Larson ELECTRONICALLY SIGNED 01/20/2011  8:31 

## 2014-04-27 NOTE — Discharge Summary (Signed)
PATIENT NAME:  Rebecca Larson, Rebecca Larson MR#:  [id number] DATE OF BIRTH:  Mar 17, 1955  DATE OF ADMISSION:  01/20/2011 DATE OF DISCHARGE:  01/24/2011  ADMITTING PHYSICIAN: Alwyn PeaJignesh Patel, MD   DISCHARGING PHYSICIAN: Enid Baasadhika Carzell Saldivar, MD   PRIMARY MD: None local, [facility]   CONSULTATION IN THE HOSPITAL: Cardiology consultation by Dr. Arnoldo HookerBruce Kowalski   DISCHARGE DIAGNOSES:  1. Acute chronic obstructive pulmonary disease exacerbation.  2. Left lower lobe pneumonia.  3. Urinary tract infection.  4. New onset rapid atrial fibrillation and converted back to normal sinus rhythm.  5. History of Crohn's disease.  6. Depression and anxiety.  7. Chronic pain syndrome, on methadone. 8. Psoriasis of skin.   9. Tobacco use disorder. 10. Leukocytosis in the hospital partly from pneumonia and also steroid use.   DISCHARGE HOME MEDICATIONS:  1. Asacol 800 mg p.o. t.i.d.  2. Effexor XR 150 mg p.o. daily.  3. Advair 250/50 1 puff b.i.d.  4. Methadone 20 mg p.o. q.6 hours.  5. Percocet 5/325 mg 1 tablet every six hours as needed.  6. Diazepam 10 mg p.o. t.i.d. as needed for anxiety.  7. Prednisone taper.  8. Omnicef 300 mg p.o. q.12 hours for four more days.  9. Finished azithromycin while in the hospital.  10. Imdur 30 mg p.o. daily.  11. Cardizem 120 mg p.o. daily.  12. Spiriva HandiHaler 1 capsule inhalation daily.  13. Albuterol MDI 1 to 2 puffs every six hours as needed.   DISCHARGE DIET: Low sodium diet.   DISCHARGE ACTIVITY: As tolerated.    FOLLOW-UP INSTRUCTIONS:  1. Follow-up with PCP in 1 to 2 weeks.  2. Follow-up with dermatologist for psoriasis in 1 to 2 weeks.  3. Pulmonary follow-up with Dr. Meredeth IdeFleming in 2 to 3 weeks.  4. Advised smoking cessation.  5. WBC count follow-up at PCP visit.   LABS AT THE TIME OF DISCHARGE: WBC was 19.1, hemoglobin 13.6, hematocrit 41.7, platelet count 370, sodium 141, potassium 3.6, chloride 101, bicarb 30, BUN 13, creatinine 0.75, glucose 126, calcium 9.4.  Cardiac enzymes have remained negative while in the hospital. Echo Doppler showing normal LV systolic function, EF of 55%, mild mitral regurgitation. TSH 0.847, within normal limits. Magnesium 2.3. Hemoglobin A1c 5.8. LDL cholesterol 142, HDL 49, total cholesterol [id number], serum triglycerides 133. Blood cultures have remained negative. Repeat chest x-ray at the time of discharge showing persistent left lower lobe infiltrate which is stable. No new regions of consolidation or infiltrate seen.   BRIEF HOSPITAL COURSE: Ms. Pricilla Holmucker is a 59 year old Caucasian female with past medical history significant for chronic obstructive pulmonary disease with ongoing smoking who was admitted to the hospital complaining of wheezing and also productive sputum. She was also dyspneic on presentation.  1. Acute on chronic obstructive pulmonary disease exacerbation. She was admitted and started on IV Solu-Medrol. Initial chest x-ray showed left lower lobe infiltrate so she was also placed on antibiotics with Rocephin and azithromycin and has been changed to cefdinir at the time of discharge and she finished five days of azithromycin while in the hospital. She did use neb treatments, Advair and Spiriva, and is being discharged on MID, Advair and also Spiriva. Blood cultures have remained negative while in the hospital and her sats have improved and she has been saturating more than 92% on room air at rest and also ambulation.  2. New onset atrial fibrillation with rapid ventricular response started in the hospital. She needed to be transferred to CCU for Cardizem drip. She rapidly  converted back to normal sinus rhythm and is on p.o. Cardizem for maintenance. She will continue p.o. Cardizem for heart rate control at this time. Beta-blockers are better avoided secondary to bronchospasm risk with her COPD. Her echo was benign and cardiac enzymes remained negative and was seen by Cardiology while in the hospital.  3. Urinary tract infection  based on abnormal urinalysis. Her cultures have remained negative but she is anyway on antibiotics at this time.  4. Persistent leukocytosis possibly from the steroids. She has been afebrile, asymptomatic, and improving at this time so she will be discharged on oral antibiotics and will follow-up with PCP as an outpatient. She will need to have a repeat WBC count in one week which has been explained to the patient.  5. Depression, anxiety, and chronic pain syndrome. Continue home medications.  6. Tobacco use disorder. Strongly counseled to quit in light of her COPD and recent pneumonia. Nicotine patch was used in the hospital. The patient does say that she wants to quit after discharge. Her course has been otherwise uneventful in the hospital.   DISCHARGE CONDITION: Stable.   DISCHARGE DISPOSITION: Home.   TIME SPENT ON DISCHARGE: 40 minutes.   ____________________________ Enid Baas, MD rk:drc D: 01/24/2011 16:08:15 ET T: 01/26/2011 08:21:45 ET JOB#: [id number]  cc: Enid Baas, MD, <Dictator> Herbon E. Meredeth Ide, MD Enid Baas MD ELECTRONICALLY SIGNED 01/28/2011 14:46

## 2014-04-27 NOTE — H&P (Signed)
PATIENT NAME:  Rebecca PalmsUCKER, Zailah W MR#:  [id number] DATE OF BIRTH:  1955/07/14  DATE OF ADMISSION:  01/20/2011  PRIMARY CARE PHYSICIAN: Dr. Deland PrettyGriner  EMERGENCY ROOM PHYSICIAN: Maurilio LovelyNoelle McLaurin, MD  CHIEF COMPLAINT: Shortness of breath.  HISTORY OF PRESENT ILLNESS: The patient is a 59 year old female who presents with the chief complaint of shortness of breath. Symptoms began one week ago associated with wheezing and cough productive of yellow sputum. The patient's shortness of breath and wheezing has been getting worse over the last couple of days. Yesterday she felt sick to her stomach and felt nauseated. She has been using albuterol nebulizers two times a day in the last couple of days. She has had to use the albuterol up to four times daily. The patient reports that she has chest pain when she tries to take a deep breath in. She denies any hemoptysis.   PAST MEDICAL HISTORY:  1. Chronic obstructive pulmonary disease. 2. Back pain. 3. Depression and anxiety. 4. Crohn's disease. 5. Nephrolithiasis. 6. Psoriasis. 7. Pneumonia.  PAST SURGICAL HISTORY:  1. Cholecystectomy.  2. Appendectomy.  3. Hysterectomy.  4. Hernia repair.   ALLERGIES: No known drug allergies.   CURRENT MEDICATIONS:  1. Acetaminophen/hydrocodone 325/5 mg every 6 hours. 2. Advair Diskus 250/50 one twice a day. 3. Albuterol one puff every 4 to 6 hours. 4. Asacol 400 mg two three times daily. 5. Diazepam 10 mg p.o. three times daily p.r.n.  6. Effexor ER 150 mg p.o. daily.  7. Methadone 10 mg two tablets p.o. every 6 hours.  SOCIAL HISTORY: The patient smokes 1/2 pack per day. She denies alcohol abuse or drug abuse. She is married. She lives with her husband.   FAMILY HISTORY: Positive for hypertension, stroke, and coronary artery disease.  see addendum   ____________________________ Donia AstJignesh S. Saxon Crosby, MD jsp:slb D: 01/20/2011 01:16:41 ET T: 01/20/2011 08:06:28 ET JOB#: [id number]  cc: Donia AstJignesh S. Starr Urias, MD,  <Dictator> Dr. Myrla HalstedGriner Sunny Gains S Sharol Croghan MD ELECTRONICALLY SIGNED 01/20/2011 8:30

## 2014-04-27 NOTE — Consult Note (Signed)
Present Illness 59 year old female with known cardiovascular risk factors including smoking, hypertension, hyperlipidemia who has had new-onset of bronchitis and shortness of breath.  The patient has not had any episode or concerns for acute angina, myocardial infarction or congestive heart failure.  She had new onset of atrial fibrillation with rapid ventricular rate with an EKG showing this with nonspecific ST changes.  There's been no evidence of M artery infarction by troponin and CK-MB.  The patient was placed on appropriate medications and had improvement of her lung disease and hypoxia.  The patient also has had spontaneous conversion of atrial fibrillation back to normal sinus rhythm with telemetry showing normal sinus rhythm with no other ischemic changes.  Patient is improved and comfortable at this time and hemodynamically stable  Family history Mother had atrial fibrillation and hypertension  Social history Patient smokes one pack per day but denies alcohol use   Physical Exam:   GEN WD    HEENT pink conjunctivae    NECK No masses    RESP normal resp effort  wheezing  crackles    CARD Regular rate and rhythm    ABD denies tenderness  soft    LYMPH negative neck    EXTR negative cyanosis/clubbing    NEURO cranial nerves intact    PSYCH alert   Review of Systems:   Subjective/Chief Complaint I had shortness of breath and palpitations    Respiratory: Short of breath    Review of Systems: All other systems were reviewed and found to be negative    Medications/Allergies Reviewed Medications/Allergies reviewed     Kidney Stones:    recurrent UTI's:    Psoriasis:    COPD:    Pneumonia:    Crohn's Disease:    Back Pain, Chronic:    c section:    Cholecystectomy:    Pubovaginal sling:    Hernia Repair:    Appendectomy:    Hysterectomy:   Home Medications:  albuterol aerosol with adapter 90 mcg/inh: 1 puff(s) inhaled every 4 to 6 hours ,  Active  Asacol 400 mg oral enteric coated tablet: 2 tab(s) orally 3 times a day , Active  Effexor XR 150 mg oral capsule, extended release: 1 cap(s) orally once a day , Active  Advair Diskus 250 mcg-50 mcg inhalation powder: 1 puff(s) inhaled 2 times a day , Active  methadone 10 mg oral tablet: 2  orally every 6 hours , Active  acetaminophen-oxycodone 325 mg-5 mg oral tablet: 1  orally every 6 hours , Active  diazepam 10 mg oral tablet: 1  orally 3 times a day as needed  , Active  Cardiology:  16-Jan-13 18:36    Ventricular Rate 122   Atrial Rate 122   P-R Interval 160   QRS Duration 102   QT 284   QTc 404   P Axis 50   R Axis 48   T Axis 94  Routine Chem:  16-Jan-13 21:12    Glucose, Serum 112   BUN 8   Creatinine (comp) 0.50   Sodium, Serum 137   Potassium, Serum 4.4   Chloride, Serum 95   CO2, Serum 32   Calcium (Total), Serum 10.3   Anion Gap 10   Osmolality (calc) 273   eGFR (African American) >60   eGFR (Non-African American) >60  Routine Hem:  16-Jan-13 21:12    WBC (CBC) 17.3   RBC (CBC) 4.88   Hemoglobin (CBC) 14.4   Hematocrit (CBC) 44.5  Platelet Count (CBC) 320   MCV 91   MCH 29.4   MCHC 32.3   RDW 17.0  Routine UA:  16-Jan-13 21:12    Color (UA) Yellow   Clarity (UA) Hazy   Glucose (UA) Negative   Bilirubin (UA) Negative   Ketones (UA) Negative   Specific Gravity (UA) 1.005   Blood (UA) Negative   pH (UA) 6.0   Protein (UA) Negative   Nitrite (UA) Negative   Leukocyte Esterase (UA) 3+   RBC (UA) 2 /HPF   WBC (UA) 10 /HPF   Bacteria (UA) TRACE   Epithelial Cells (UA) 1 /HPF  Lab:  17-Jan-13 02:00    pH (ABG) 7.38   PCO2 49   PO2 82   FiO2 28   Base Excess 2.9   HCO3 29.0   O2 Saturation 95.8   Specimen Type (ABG) ARTERIAL   Patient Temp (ABG) 37.0  Cardiac:  17-Jan-13 02:55    CK, Total 137   CPK-MB, Serum 1.4   Troponin I < 0.02  Lab:  17-Jan-13 04:00    O2 Saturation (Pulse Ox) 94   FiO2 (Pulse Ox) 2L  Cardiac:   17-Jan-13 11:18    CK, Total -   CPK-MB, Serum -   Troponin I -    13:09    CK, Total 195   CPK-MB, Serum 2.3   Troponin I < 0.02    17:30    CK, Total 362   CPK-MB, Serum 3.6   Troponin I < 0.02  Routine Chem:  18-Jan-13 04:13    Glucose, Serum 106   BUN 13   Creatinine (comp) 0.58   Sodium, Serum 139   Potassium, Serum 4.2   Chloride, Serum 99   CO2, Serum 26   Calcium (Total), Serum 9.5   Anion Gap 14   Osmolality (calc) 278   eGFR (African American) >60   eGFR (Non-African American) >60   Cholesterol, Serum 218   Triglycerides, Serum 133   HDL (INHOUSE) 49   VLDL Cholesterol Calculated 27   LDL Cholesterol Calculated 142  Routine Hem:  18-Jan-13 05:19    WBC (CBC) 19.8   RBC (CBC) 4.59   Hemoglobin (CBC) 13.6   Hematocrit (CBC) 41.7   Platelet Count (CBC) 370   MCV 91   MCH 29.7   MCHC 32.6   RDW 17.1   Neutrophil % 87.3   Lymphocyte % 8.2   Monocyte % 4.3   Eosinophil % 0.0   Basophil % 0.2   Neutrophil # 17.3   Lymphocyte # 1.6   Monocyte # 0.8   Eosinophil # 0.0   Basophil # 0.0  Routine Chem:  18-Jan-13 05:19    Hemoglobin A1c ([facility]) 5.8  Routine Hem:  19-Jan-13 03:45    WBC (CBC) 17.6  Routine Chem:  19-Jan-13 11:00    Glucose, Serum 126   BUN 13   Creatinine (comp) 0.75   Sodium, Serum 141   Potassium, Serum 3.6   Chloride, Serum 101   CO2, Serum 30   Calcium (Total), Serum 9.4   Anion Gap 10   Osmolality (calc) 283   eGFR (African American) >60   eGFR (Non-African American) >60  Cardiac:  19-Jan-13 11:00    CK, Total 138   CPK-MB, Serum 4.3   Troponin I < 0.02  Routine Chem:  19-Jan-13 11:00    Magnesium, Serum 2.3  Thyroid:  19-Jan-13 11:00    Thyroid Stimulating Hormone 0.847  Cardiac:  19-Jan-13 18:24    CK, Total 74   CPK-MB, Serum 2.2   Troponin I < 0.02  Routine Hem:  20-Jan-13 02:56    WBC (CBC) 16.9  Cardiac:  20-Jan-13 02:56    CK, Total 52   CPK-MB, Serum 1.9   Troponin I < 0.02   EKG:   EKG  Interp. by me    Interpretation atrial fibrillation with rapid ventricular rate and nonspecific ST changes    Penicillin: Anaphylaxis  Vital Signs/Nurse's Notes: **Vital Signs.:   20-Jan-13 00:00   Vital Signs Type Upon Transfer   Temperature Source oral   Pulse Pulse 64   Pulse source per cardiac monitor   Respirations Respirations 24   Systolic BP Systolic BP 109   Diastolic BP (mmHg) Diastolic BP (mmHg) 71   Mean BP 86   Pulse Ox % Pulse Ox % 100   Oxygen Delivery 2L   Pulse Ox Heart Rate 70    01:00   Vital Signs Type Upon Transfer   Temperature Source oral   Pulse Pulse 70   Pulse source per cardiac monitor   Respirations Respirations 18   Systolic BP Systolic BP 323   Diastolic BP (mmHg) Diastolic BP (mmHg) 70   Mean BP 94   Pulse Ox % Pulse Ox % 94   Oxygen Delivery 2L   Pulse Ox Heart Rate 62    02:00   Vital Signs Type Upon Transfer   Temperature Source oral   Pulse Pulse 60   Pulse source per cardiac monitor   Respirations Respirations 21   Systolic BP Systolic BP 557   Diastolic BP (mmHg) Diastolic BP (mmHg) 78   Mean BP 93   Pulse Ox % Pulse Ox % 100   Oxygen Delivery 2L   Pulse Ox Heart Rate 56    03:00   Temperature Source oral   Pulse Pulse 72   Pulse source per cardiac monitor   Respirations Respirations 26   Systolic BP Systolic BP 322   Diastolic BP (mmHg) Diastolic BP (mmHg) 64   Mean BP 80   Pulse Ox % Pulse Ox % 97   Oxygen Delivery 2L   Pulse Ox Heart Rate 64    04:00   Temperature Temperature (F) 97.8   Celsius 36.5   Temperature Source oral   Pulse Pulse 48   Pulse source per cardiac monitor   Respirations Respirations 29   Systolic BP Systolic BP 025   Diastolic BP (mmHg) Diastolic BP (mmHg) 66   Mean BP 79   Pulse Ox % Pulse Ox % 99   Oxygen Delivery 2L   Pulse Ox Heart Rate 48    05:00   Temperature Source oral   Pulse Pulse 60   Pulse source per cardiac monitor   Respirations Respirations 21   Systolic BP Systolic BP  427   Diastolic BP (mmHg) Diastolic BP (mmHg) 78   Mean BP 102   Pulse Ox % Pulse Ox % 80   Oxygen Delivery 2L   Pulse Ox Heart Rate 62    06:00   Temperature Source oral   Pulse Pulse 74   Pulse source per cardiac monitor   Respirations Respirations 23   Pulse Ox % Pulse Ox % 95   Oxygen Delivery 2L   Pulse Ox Heart Rate 76    07:00   Temperature Temperature (F) 98.3   Celsius 36.8   Pulse Pulse 48   Respirations Respirations 25  Systolic BP Systolic BP 520   Diastolic BP (mmHg) Diastolic BP (mmHg) 74   Mean BP 88   Pulse Ox % Pulse Ox % 92   Oxygen Delivery Room Air/ 21 %   Pulse Ox Heart Rate 50    07:35   Nurse Fingerstick (mg/dL) FSBS (fasting range 65-99 mg/dL) 73   Comments/Interventions  Per Hyperglycemia Protocol ([facility]); Nurse Notified     Impression 59 year old female with known cardiovascular disease risk factors with acute exacerbation of asthma and/or bronchitis and new onset of atrial fibrillation with rapid ventricular rate, now converted to normal sinus rhythm with improvement of symptoms    Plan 1.  Continue heart rate control with current medical regimen and changed to long-acting for maintenance of normal sinus rhythm. 2.  No anticoagulation at this time due to short episode of atrial fibrillation and low CHA DS score  3.  Echocardiogram for LV systolic dysfunction, valvular heart disease contributing to current episode of shortness of breath as well as possibility of atrial fibrillation. 4.  Ambulate and follow for further symptoms or recurrence. 5.  Possible transfer to telemetry   Electronic Signatures: Corey Skains (MD)  (Signed 20-Jan-13 09:17)  Authored: General Aspect/Present Illness, History and Physical Exam, Review of System, Past Medical History, Home Medications, Labs, EKG , Allergies, Vital Signs/Nurse's Notes, Impression/Plan   Last Updated: 20-Jan-13 09:17 by Corey Skains (MD)
